# Patient Record
Sex: Male | Born: 1980 | Marital: Single
Health system: Midwestern US, Community
[De-identification: ages and names within clinical notes are randomized; demographics above are authoritative.]

---

## 2001-04-06 ENCOUNTER — Encounter: Payer: Self-pay | Admitting: Emergency Medicine

## 2001-04-06 ENCOUNTER — Emergency Department (HOSPITAL_COMMUNITY): Admission: EM | Admit: 2001-04-06 | Discharge: 2001-04-06 | Payer: Self-pay | Admitting: Emergency Medicine

## 2001-06-29 ENCOUNTER — Emergency Department (HOSPITAL_COMMUNITY): Admission: EM | Admit: 2001-06-29 | Discharge: 2001-06-29 | Payer: Self-pay

## 2001-08-13 ENCOUNTER — Emergency Department (HOSPITAL_COMMUNITY): Admission: EM | Admit: 2001-08-13 | Discharge: 2001-08-13 | Payer: Self-pay

## 2001-09-19 ENCOUNTER — Emergency Department (HOSPITAL_COMMUNITY): Admission: EM | Admit: 2001-09-19 | Discharge: 2001-09-19 | Payer: Self-pay | Admitting: *Deleted

## 2013-12-21 ENCOUNTER — Inpatient Hospital Stay
Admit: 2013-12-21 | Discharge: 2013-12-21 | Disposition: A | Discharge status: Home / Self Care | Attending: Emergency Medicine

## 2013-12-21 MED ORDER — IBUPROFEN 800 MG PO TABS
800 MG | ORAL_TABLET | Freq: Four times a day (QID) | ORAL | Status: AC | PRN
Start: 2013-12-21 — End: ?

## 2013-12-21 MED ORDER — HYDROCODONE-ACETAMINOPHEN 5-325 MG PO TABS (STARTER PACK)
Status: DC
Start: 2013-12-21 — End: 2013-12-21

## 2013-12-21 MED ORDER — CYCLOBENZAPRINE HCL 10 MG PO TABS
10 MG | ORAL_TABLET | Freq: Three times a day (TID) | ORAL | Status: AC | PRN
Start: 2013-12-21 — End: 2013-12-31

## 2013-12-21 MED ADMIN — metoclopramide (REGLAN) injection 10 mg: 10 mg | INTRAMUSCULAR | @ 05:00:00 | NDC 00703450201

## 2013-12-21 MED ADMIN — HYDROcodone-acetaminophen (NORCO) 5-325 MG per tablet 2 tablet: 2 | ORAL | @ 06:00:00 | NDC 00591320201

## 2013-12-21 MED ADMIN — diphenhydrAMINE (BENADRYL) injection 25 mg: 25 mg | INTRAMUSCULAR | @ 05:00:00 | NDC 63323066401

## 2013-12-21 MED FILL — HYDROCODONE-ACETAMINOPHEN 5-325 MG PO TABS (STARTER PACK): Qty: 4

## 2013-12-21 MED FILL — DIPHENHYDRAMINE HCL 50 MG/ML IJ SOLN: 50 MG/ML | INTRAMUSCULAR | Qty: 1

## 2013-12-21 MED FILL — IBUPROFEN 600 MG PO TABS: 600 MG | ORAL | Qty: 1

## 2013-12-21 MED FILL — HYDROCODONE-ACETAMINOPHEN 5-325 MG PO TABS: 5-325 MG | ORAL | Qty: 2

## 2013-12-21 MED FILL — METOCLOPRAMIDE HCL 5 MG/ML IJ SOLN: 5 MG/ML | INTRAMUSCULAR | Qty: 2

## 2013-12-21 MED FILL — KETOROLAC TROMETHAMINE 60 MG/2ML IM SOLN: 60 MG/2ML | INTRAMUSCULAR | Qty: 2

## 2013-12-21 NOTE — ED Notes (Signed)
Patient refused motrin, Dr. Jimmey Ralph aware    Izora Gala, RN  12/21/13 [phone]
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
## 2013-12-21 NOTE — Discharge Instructions (Signed)
Neck Strain: After Your Visit  Your Care Instructions  You have strained the muscles and ligaments in your neck. A sudden, awkward movement can strain the neck. This often occurs with falls or car accidents or during certain sports. Everyday activities like working on a computer or sleeping can also cause neck strain if they force you to hold your neck in an awkward position for a long time.  It is common for neck pain to get worse for a day or two after an injury, but it should start to feel better after that. You may have more pain and stiffness for several days before it gets better. This is expected. It may take a few weeks or longer for it to heal completely. Good home treatment can help you get better faster and avoid future neck problems.  Follow-up care is a key part of your treatment and safety. Be sure to make and go to all appointments, and call your doctor if you are having problems. It's also a good idea to know your test results and keep a list of the medicines you take.  How can you care for yourself at home?   If you were given a neck brace (cervical collar) to limit neck motion, wear it as instructed for as many days as your doctor tells you to. Do not wear it longer than you were told to. Wearing a brace for too long can make neck stiffness worse and weaken the neck muscles.   You can try using heat or ice to see if it helps.   Try using a heating pad on a low or medium setting for 15 to 20 minutes every 2 to 3 hours. Try a warm shower in place of one session with the heating pad. You can also buy single-use heat wraps that last up to 8 hours.   You can also try an ice pack for 10 to 15 minutes every 2 to 3 hours.   Take pain medicines exactly as directed.   If the doctor gave you a prescription medicine for pain, take it as prescribed.   If you are not taking a prescription pain medicine, ask your doctor if you can take an over-the-counter medicine.   Gently rub the area to relieve pain  and help with blood flow. Do not massage the area if it hurts to do so.   Do not do anything that makes the pain worse. Take it easy for a couple of days. You can do your usual activities if they do not hurt your neck or put it at risk for more stress or injury.   Try sleeping on a special neck pillow. Place it under your neck, not under your head. Placing a tightly rolled-up towel under your neck while you sleep will also work. If you use a neck pillow or rolled towel, do not use your regular pillow at the same time.   To prevent future neck pain, do exercises to stretch and strengthen your neck and back. Learn how to use good posture, safe lifting techniques, and proper body mechanics.  When should you call for help?  Call 911 anytime you think you may need emergency care. For example, call if:   You lose bladder or bowel control.   You have weakness in your arms or legs.  Call your doctor now or seek immediate medical care if:   You have new pain, numbness, or tingling in your arms, hands, or legs.  Watch closely for   changes in your health, and be sure to contact your doctor if:   Your neck pain gets worse.   Your neck pain is not better after 1 week. It may take longer for the pain to go away completely, but it should feel at least a little better.   Where can you learn more?   Go to [email or web address] and sign in to your MyChart account. Enter M253 in the [place] box to learn more about "Neck Strain: After Your Visit."    If you do not have an account, please click on the "Sign Up Now" link.      2006-2015 Healthwise, Incorporated. Care instructions adapted under license by [facility]. This care instruction is for use with your licensed healthcare professional. If you have questions about a medical condition or this instruction, always ask your healthcare professional. [place] any warranty or liability for your use of this  information.  Content Version: 10.5.422740; Current as of: March 05, 2013                Learning About a Closed Head Injury  What is a closed head injury?     A closed head injury happens when your head gets hit hard. The strong force of the blow causes your brain to shake in your skull. This movement can cause the brain to bruise, swell, or tear. Sometimes nerves or blood vessels also get damaged. This can cause bleeding in or around the brain.  A concussion is a type of closed head injury.  What are the symptoms?  If you have a mild concussion, you may have a mild headache or feel "not quite right." These symptoms are common. They usually go away over a few days to 4 weeks.  But sometimes after a concussion, you feel like you can't function as well as before the injury. And you have new symptoms. This is called postconcussive syndrome. You may:   Find it harder to solve problems, think, concentrate, or remember.   Have headaches.   Have changes in your sleep patterns, such as not being able to sleep or sleeping all the time.   Have changes in your personality.   Not be interested in your usual activities.   Feel angry or anxious without a clear reason.   Have changes in your sex drive.   Lose your sense of taste or smell.   Be dizzy, lightheaded, or unsteady. It may be hard to stand or walk.  How is a closed head injury treated?  Any person who may have a concussion needs to see a doctor. Some people have to stay in the hospital to be watched. Others can go home safely. But if you go home, make sure to have someone watch you closely.  Rest is the best treatment. Get plenty of sleep at night. And try to rest during the day.   Avoid activities that are physically or mentally demanding. These include housework, exercise, and schoolwork. And don't play video games, send text messages, or use the computer. You may need to change your school or work schedule to be able to avoid these activities.   Ask  your doctor when it's okay to drive, ride a bike, or operate machinery.   Take an over-the-counter pain medicine, such as acetaminophen (Tylenol), ibuprofen (Advil, Motrin), or naproxen (Aleve). Be safe with medicines. Read and follow all instructions on the label.   Check with your doctor before you use any other medicines  for pain.   Do not drink alcohol or use illegal drugs. They can slow recovery. They can also increase your risk of getting a second head injury.  Follow-up care is a key part of your treatment and safety. Be sure to make and go to all appointments, and call your doctor if you are having problems. It's also a good idea to know your test results and keep a list of the medicines you take.   Where can you learn more?   Go to [email or web address] and sign in to your MyChart account. Enter E235 in the [place] box to learn more about "Learning About a Closed Head Injury."    If you do not have an account, please click on the "Sign Up Now" link.      2006-2015 Healthwise, Incorporated. Care instructions adapted under license by [facility]. This care instruction is for use with your licensed healthcare professional. If you have questions about a medical condition or this instruction, always ask your healthcare professional. [place] any warranty or liability for your use of this information.  Content Version: 10.5.422740; Current as of: September 23, 2012                Muscle Strain: After Your Visit  Your Care Instructions  A muscle strain happens when you overstretch, or pull, a muscle. It can happen when you exercise or lift something or when you have an accident. Rest and other home care can help the muscle heal.  Follow-up care is a key part of your treatment and safety. Be sure to make and go to all appointments, and call your doctor if you are having problems. It's also a good idea to know your test results and keep a list of the  medicines you take.  How can you care for yourself at home?   Rest the strained muscle. Do not put weight on it for a day or two. If your doctor advises you to, use crutches or a sling to rest a sore limb.   Put ice or a cold pack on the sore muscle for 10 to 20 minutes at a time to stop swelling. Put a thin cloth between the ice pack and your skin.   Prop up the sore arm or leg on a pillow when you ice it or anytime you sit or lie down during the next 3 days. Try to keep it above the level of your heart. This will help reduce swelling.   Take pain medicines exactly as directed.   If the doctor gave you a prescription medicine for pain, take it as prescribed.   If you are not taking a prescription pain medicine, ask your doctor if you can take an over-the-counter medicine.   Do not do anything that makes the pain worse. Return to exercise gradually as you feel better.  When should you call for help?  Call your doctor now or seek immediate medical care if:   You have new severe pain.   Your injured limb is cool or pale or changes color.   You have tingling, weakness, or numbness in your injured limb.   You cannot move the injured area.  Watch closely for changes in your health, and be sure to contact your doctor if:   You cannot put weight on a joint, or it feels unsteady when you walk.   Pain and swelling get worse or do not start to get better after 2 days of home  treatment.   Where can you learn more?   Go to [email or web address] and sign in to your MyChart account. Enter [phone] in the [place] box to learn more about "Muscle Strain: After Your Visit."    If you do not have an account, please click on the "Sign Up Now" link.      2006-2015 Healthwise, Incorporated. Care instructions adapted under license by [facility]. This care instruction is for use with your licensed healthcare professional. If you have questions about a medical condition or this instruction,  always ask your healthcare professional. [place] any warranty or liability for your use of this information.  Content Version: 10.5.422740; Current as of: March 05, 2013

## 2018-03-04 ENCOUNTER — Emergency Department (HOSPITAL_COMMUNITY)
Admission: EM | Admit: 2018-03-04 | Discharge: 2018-03-04 | Disposition: A | Discharge status: Home / Self Care | Payer: Self-pay | Attending: Emergency Medicine | Admitting: Emergency Medicine

## 2018-03-04 ENCOUNTER — Encounter (HOSPITAL_COMMUNITY): Payer: Self-pay | Admitting: *Deleted

## 2018-03-04 ENCOUNTER — Emergency Department (HOSPITAL_COMMUNITY): Payer: Self-pay

## 2018-03-04 ENCOUNTER — Other Ambulatory Visit: Payer: Self-pay

## 2018-03-04 DIAGNOSIS — J690 Pneumonitis due to inhalation of food and vomit: Secondary | ICD-10-CM | POA: Insufficient documentation

## 2018-03-04 DIAGNOSIS — T401X1A Poisoning by heroin, accidental (unintentional), initial encounter: Secondary | ICD-10-CM | POA: Insufficient documentation

## 2018-03-04 LAB — CBC WITH DIFFERENTIAL/PLATELET
ABS IMMATURE GRANULOCYTES: 0.02 10*3/uL (ref 0.00–0.07)
BASOS ABS: 0.1 10*3/uL (ref 0.0–0.1)
BASOS PCT: 0 %
Eosinophils Absolute: 0.2 10*3/uL (ref 0.0–0.5)
Eosinophils Relative: 2 %
HCT: 44.6 % (ref 39.0–52.0)
Hemoglobin: 14.8 g/dL (ref 13.0–17.0)
IMMATURE GRANULOCYTES: 0 %
LYMPHS ABS: 6.2 10*3/uL — AB (ref 0.7–4.0)
Lymphocytes Relative: 54 %
MCH: 30.1 pg (ref 26.0–34.0)
MCHC: 33.2 g/dL (ref 30.0–36.0)
MCV: 90.7 fL (ref 80.0–100.0)
MONOS PCT: 8 %
Monocytes Absolute: 0.9 10*3/uL (ref 0.1–1.0)
NEUTROS ABS: 4.1 10*3/uL (ref 1.7–7.7)
NEUTROS PCT: 36 %
NRBC: 0 % (ref 0.0–0.2)
PLATELETS: 410 10*3/uL — AB (ref 150–400)
RBC: 4.92 MIL/uL (ref 4.22–5.81)
RDW: 12.2 % (ref 11.5–15.5)
WBC: 11.4 10*3/uL — ABNORMAL HIGH (ref 4.0–10.5)

## 2018-03-04 LAB — SALICYLATE LEVEL: Salicylate Lvl: <7 mg/dL (ref 2.8–30.0)

## 2018-03-04 LAB — COMPREHENSIVE METABOLIC PANEL
ALBUMIN: 4 g/dL (ref 3.5–5.0)
ALK PHOS: 91 U/L (ref 38–126)
ALT: 61 U/L — AB (ref 0–44)
AST: 59 U/L — AB (ref 15–41)
Anion gap: 8 (ref 5–15)
BUN: 14 mg/dL (ref 6–20)
CALCIUM: 9.3 mg/dL (ref 8.9–10.3)
CO2: 25 mmol/L (ref 22–32)
CREATININE: 1.13 mg/dL (ref 0.61–1.24)
Chloride: 102 mmol/L (ref 98–111)
GFR calc Af Amer: >60 mL/min (ref >60)
GFR calc non Af Amer: >60 mL/min (ref >60)
GLUCOSE: 107 mg/dL — AB (ref 70–99)
Potassium: 3.9 mmol/L (ref 3.5–5.1)
SODIUM: 135 mmol/L (ref 135–145)
Total Bilirubin: 0.4 mg/dL (ref 0.3–1.2)
Total Protein: 7.5 g/dL (ref 6.5–8.1)

## 2018-03-04 LAB — ACETAMINOPHEN LEVEL: Acetaminophen (Tylenol), Serum: <10 ug/mL — ABNORMAL LOW (ref 10–30)

## 2018-03-04 LAB — CBG MONITORING, ED: Glucose-Capillary: 98 mg/dL (ref 70–99)

## 2018-03-04 LAB — ETHANOL

## 2018-03-04 MED ORDER — ONDANSETRON HCL 4 MG/2ML IJ SOLN
4.0000 mg | Freq: Once | INTRAMUSCULAR | Status: AC
  Administered 2018-03-04: 4 mg via INTRAVENOUS

## 2018-03-04 MED ORDER — SODIUM CHLORIDE 0.9 % IV BOLUS
1000.0000 mL | Freq: Once | INTRAVENOUS | Status: AC
  Administered 2018-03-04: 1000 mL via INTRAVENOUS

## 2018-03-04 MED ORDER — NALOXONE HCL 4 MG/0.1ML NA LIQD
1.0000 | Freq: Once | NASAL | Status: AC
  Administered 2018-03-04: 1 via NASAL
  Filled 2018-03-04: qty 4

## 2018-03-04 MED ORDER — NALOXONE HCL 4 MG/0.1ML NA LIQD: NASAL | 0 refills | Status: AC

## 2018-03-04 MED ORDER — NALOXONE HCL 2 MG/2ML IJ SOSY
2.0000 mg | PREFILLED_SYRINGE | Freq: Once | INTRAMUSCULAR | Status: AC
  Administered 2018-03-04: 2 mg via INTRAVENOUS

## 2018-03-04 MED ORDER — AMOXICILLIN-POT CLAVULANATE 875-125 MG PO TABS
1.0000 | ORAL_TABLET | Freq: Once | ORAL | Status: AC
  Administered 2018-03-04: 1 via ORAL
  Filled 2018-03-04: qty 1

## 2018-03-04 MED ORDER — AMOXICILLIN-POT CLAVULANATE 875-125 MG PO TABS: 1.0000 | ORAL_TABLET | Freq: Two times a day (BID) | ORAL | 0 refills | Status: AC

## 2018-03-04 MED ORDER — SODIUM CHLORIDE 0.9 % IV SOLN
INTRAVENOUS | Status: DC
  Administered 2018-03-04: 07:00:00 via INTRAVENOUS
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
## 2018-03-04 NOTE — ED Notes (Signed)
Patient is alert  Admits to shooting  Heroin and taking meth. States he does this daily. Denies SI

## 2018-03-04 NOTE — ED Notes (Signed)
Pt sats on room air while ambulating 97%. Pt tolerated well

## 2018-03-04 NOTE — ED Notes (Signed)
Esignature pad not available. Pt agreeable to discharge; understands instructions and was given narcan nasal spray.

## 2018-03-04 NOTE — Discharge Instructions (Signed)
To find a primary care or specialty doctor please call [phone] or [phone] to access "[facility] Find a Doctor Service." ° °You may also go on the [facility] website at [email or web address] ° °There are also multiple [facility], [facility], [facility] and [facility] practices throughout the [place] that are frequently accepting new patients. You may find a clinic that is close to your home and contact them. ° °[facility] -  °[address] °[place] [address] °[phone] ° ° °[facility] -  °[address] °[place] [address] [phone] ° ° °[facility] - °[address]  °[place] [address] °[phone] ° ° °

## 2018-03-04 NOTE — ED Notes (Signed)
Patient is sleepy however is easily arousable  to verbal stimuli. Denies pain

## 2018-03-04 NOTE — ED Notes (Signed)
Pt transported to Xray. 

## 2018-03-04 NOTE — ED Provider Notes (Signed)
TIME SEEN: 5:20 AM  CHIEF COMPLAINT: Heroin overdose  HPI: Patient is a 37 year old male with history of heroin abuse who presents to the emergency department as a heroin overdose.  Brought in by his significant other.  Patient was pulseless, apneic, cyanotic.  CPR being performed in route by friends.  ROS: Level 5 caveat secondary to cardiac arrest  PAST MEDICAL HISTORY/PAST SURGICAL HISTORY:  No past medical history on file.  MEDICATIONS:  Prior to Admission medications   Not on File    ALLERGIES:  Allergies not on file  SOCIAL HISTORY:  Social History   Tobacco Use  . Smoking status: Not on file  Substance Use Topics  . Alcohol use: Not on file    FAMILY HISTORY: No family history on file.  EXAM: BP (!) 132/95 (BP Location: Right Arm)   Pulse (!) 121   Temp 97.7 F (36.5 C) (Oral)   Resp (!) 6   SpO2 99%  CONSTITUTIONAL: Initial GCS was 3.  Patient was unresponsive.  After Narcan patient now oriented x3. HEAD: Normocephalic, atraumatic EYES: Conjunctivae clear, pupils appear equal, EOMI ENT: normal nose; moist mucous membranes NECK: Supple, no meningismus, no nuchal rigidity, no LAD  CARD: Patient was initially pulseless but then regained pulses quickly in the emergency department.  He is now regular and tachycardic after Narcan.  S1 and S2 appreciated; no murmurs, no clicks, no rubs, no gallops RESP: Initially patient was cyanotic and apneic.  Resuscitated with BVM.  After Narcan patient now has normal chest excursion without splinting or tachypnea; breath sounds clear and equal bilaterally; no wheezes, no rhonchi, no rales, no hypoxia or respiratory distress, speaking full sentences ABD/GI: Normal bowel sounds; non-distended; soft, non-tender, no rebound, no guarding, no peritoneal signs, no hepatosplenomegaly BACK:  The back appears normal and is non-tender to palpation, there is no CVA tenderness EXT: Normal ROM in all joints; non-tender to palpation; no edema;  normal capillary refill; no cyanosis, no calf tenderness or swelling    SKIN: Normal color for age and race; warm; no rash NEURO: Moves all extremities equally, normal speech, normal gait PSYCH: The patient's mood and manner are appropriate. Grooming and personal hygiene are appropriate.  Denies SI or HI.  MEDICAL DECISION MAKING: Patient here after an intentional heroin overdose.  States that he has been shooting up heroin for the past few months.  States that this was not an attempt to hurt himself.  Was initially pulseless and apneic and cyanotic.  Given Narcan and is now oriented x3, neurologically intact, hemodynamically stable other than tachycardia.  He has no complaints of pain currently.  Will closely monitor in the ED.  Will check labs, urine.  EKG shows sinus tachycardia without ischemia.  ED PROGRESS: Patient's labs unremarkable other than mild elevation of AST and ALT.  He is drowsy but arousable.  Hemodynamically stable.  Will continue to observe patient.  Signed out to Dr. Erma Heritage to reassess patient once more awake.  Anticipate discharge home.  He has been provided with nasal Narcan spray to use at home as needed.  Patient's fianc updated with plan.   I reviewed all nursing notes, vitals, pertinent previous records, EKGs, lab and urine results, imaging (as available).       EKG Interpretation  Date/Time:  Wednesday March 04 2018 05:17:26 EST Ventricular Rate:  120 PR Interval:    QRS Duration: 88 QT Interval:  324 QTC Calculation: 460 R Axis:   90 Text Interpretation:  Sinus tachycardia Borderline  right axis deviation No old tracing to compare Confirmed by Ward, Baxter HireKristen [phone]([id number]) on 03/04/2018 5:20:11 AM        CRITICAL CARE Performed by: Baxter HireKristen Ward   Total critical care time: 45 minutes  Critical care time was exclusive of separately billable procedures and treating other patients.  Critical care was necessary to treat or prevent imminent or life-threatening  deterioration.  Critical care was time spent personally by me on the following activities: development of treatment plan with patient and/or surrogate as well as nursing, discussions with consultants, evaluation of patient's response to treatment, examination of patient, obtaining history from patient or surrogate, ordering and performing treatments and interventions, ordering and review of laboratory studies, ordering and review of radiographic studies, pulse oximetry and re-evaluation of patient's condition.    Ward, Layla MawKristen N, DO 03/04/18 [phone]

## 2018-03-04 NOTE — ED Triage Notes (Signed)
Patient presents to ed by POV , was driven up to the EMS entrance  Unresponsive in the car, faint pulses very shadow resp.  Brought into resc room Dr. Elesa MassedWard at bedside. Patient was being bagged, #18 g. Iv started left a/c, narcan 2mg  given iv patient arousable admits to injecting Heroin.

## 2020-05-07 IMAGING — DX DG CHEST 2V
2 series · 2 of 2 positions shown · non-contrast
Comparison: None.

CLINICAL DATA: Overdose induced decreased level of consciousness.
Possible aspiration or pulmonary edema.

EXAM:
CHEST - 2 VIEW

[chest lat]
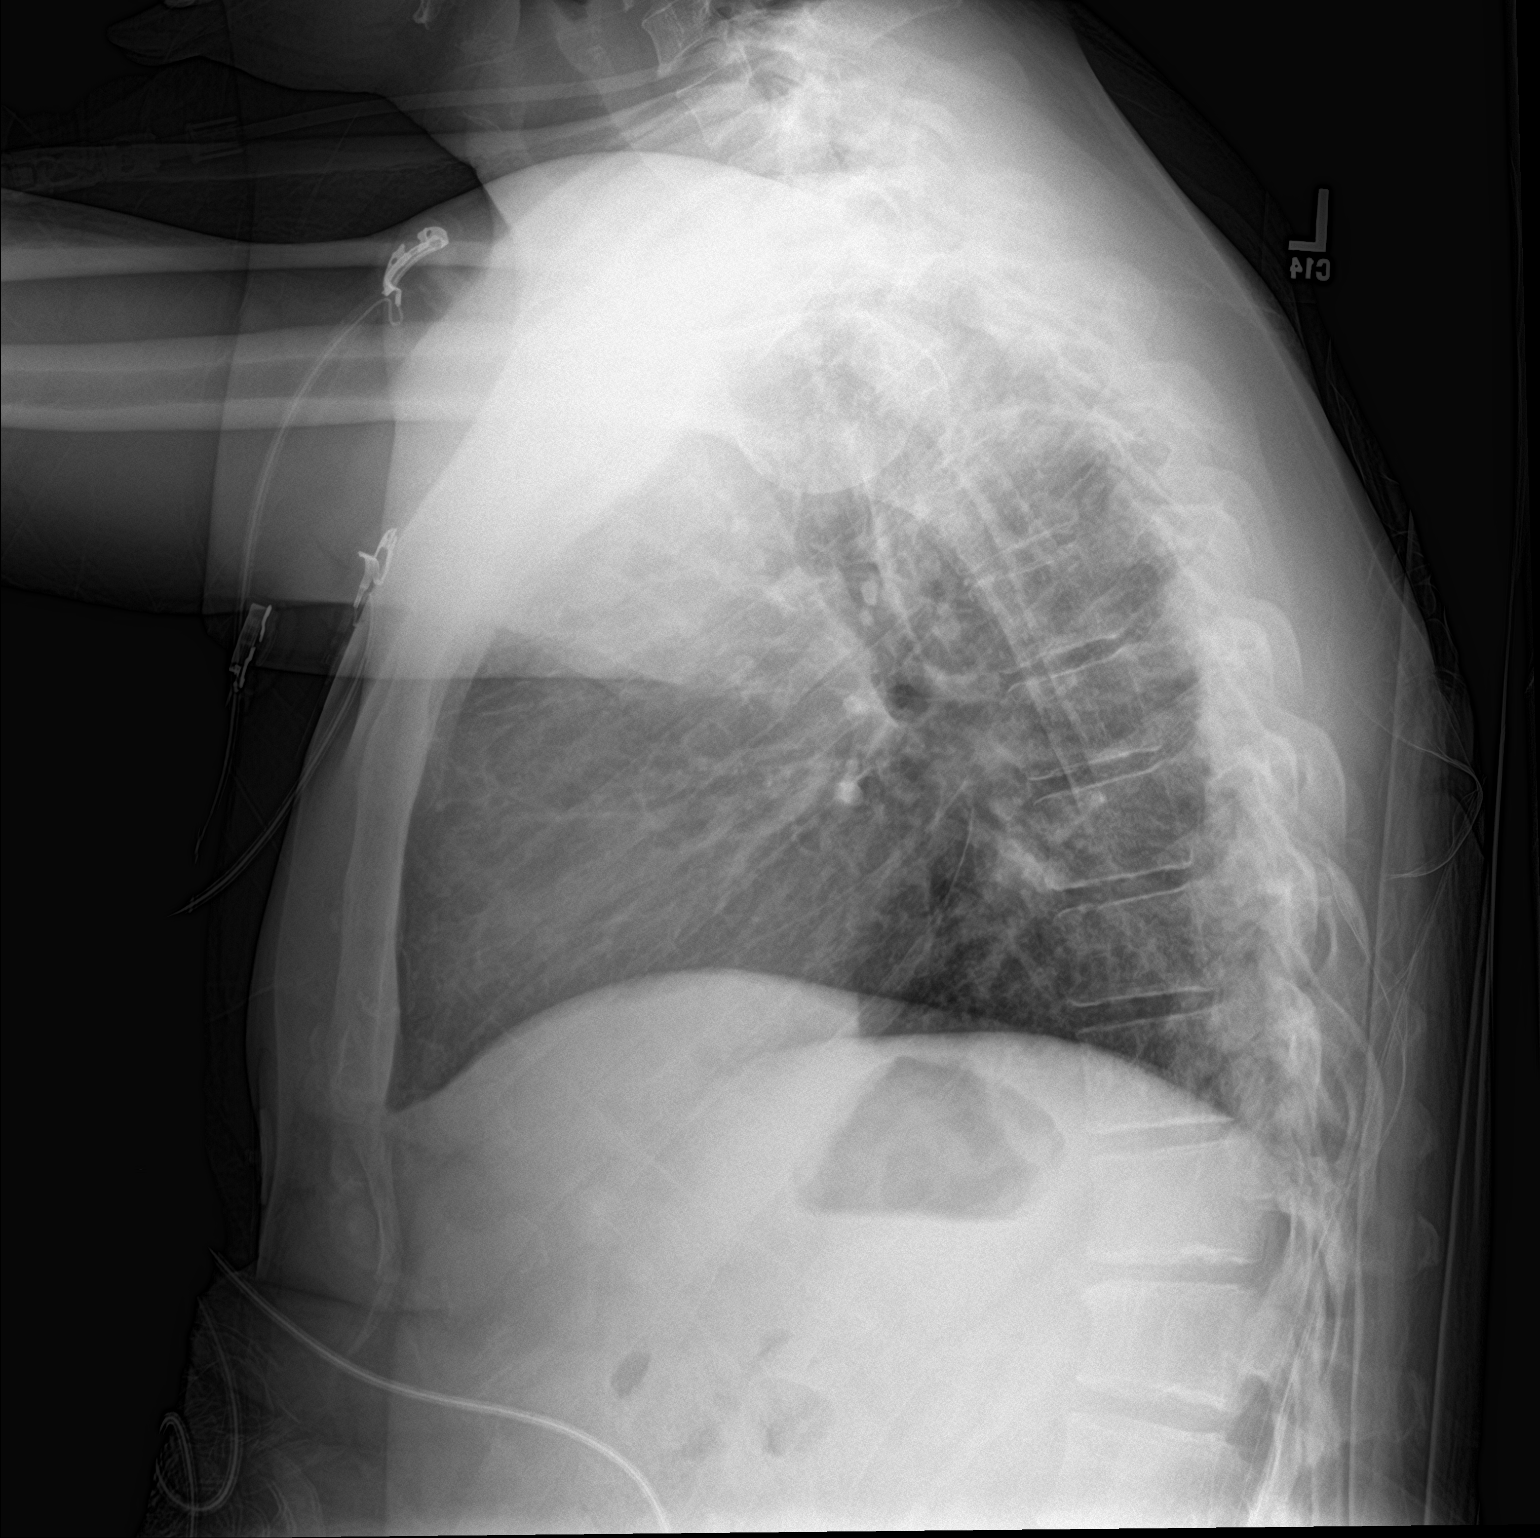

[chest ap]
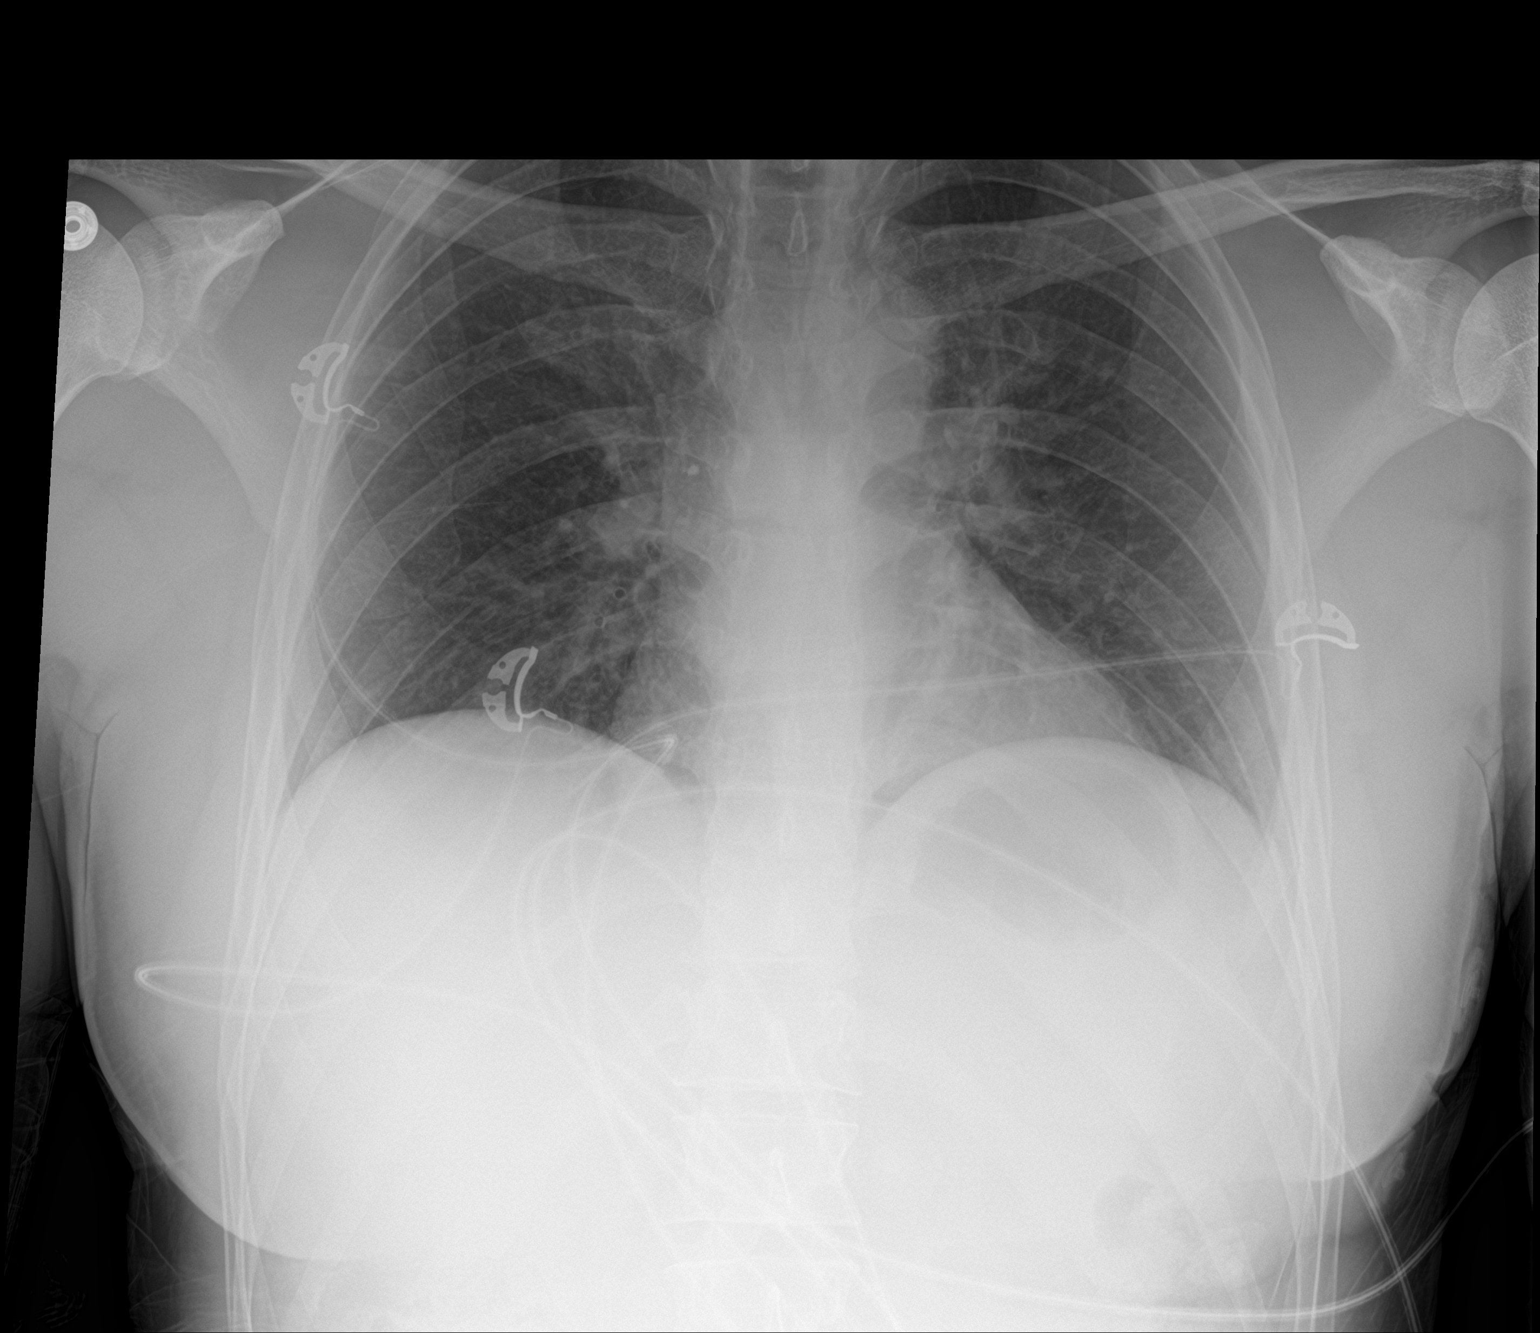

[2 of 2 positions shown; findings below may reference images not displayed]

FINDINGS: The lungs are adequately inflated. The perihilar lung markings are
coarse. There is no pleural effusion or discrete infiltrate. There
is subtle increased density that projects posterior to the lower
thoracic spine on the lateral view which may reflect peripheral
infiltrate. The heart is normal in size. The central pulmonary
vascularity is indistinct.
IMPRESSION: Findings suggesting mild perihilar pulmonary edema or less likely
atelectasis. Probable infiltrate or atelectasis posteriorly in the
left lower lobe.
